# Patient Record
Sex: Male | Born: 1955 | Race: White | Hispanic: No | Smoking: Former smoker
Health system: Southern US, Community
[De-identification: ages and names within clinical notes are randomized; demographics above are authoritative.]

## PROBLEM LIST (undated history)

## (undated) DIAGNOSIS — E119 Type 2 diabetes mellitus without complications: Secondary | ICD-10-CM

## (undated) DIAGNOSIS — I10 Essential (primary) hypertension: Secondary | ICD-10-CM

## (undated) DIAGNOSIS — N289 Disorder of kidney and ureter, unspecified: Secondary | ICD-10-CM

## (undated) DIAGNOSIS — I519 Heart disease, unspecified: Secondary | ICD-10-CM

## (undated) DIAGNOSIS — E785 Hyperlipidemia, unspecified: Secondary | ICD-10-CM

## (undated) HISTORY — PX: CARDIAC DEFIBRILLATOR PLACEMENT: SHX171

## (undated) HISTORY — PX: HERNIA REPAIR: SHX51

---

## 1999-10-30 HISTORY — PX: HEART TRANSPLANT: SHX268

## 2020-10-08 ENCOUNTER — Encounter: Payer: Self-pay | Admitting: Emergency Medicine

## 2020-10-08 ENCOUNTER — Ambulatory Visit
Admission: EM | Admit: 2020-10-08 | Discharge: 2020-10-08 | Disposition: A | Discharge status: Home / Self Care | Payer: Medicare Other | Attending: Family Medicine | Admitting: Family Medicine

## 2020-10-08 ENCOUNTER — Other Ambulatory Visit: Payer: Self-pay

## 2020-10-08 ENCOUNTER — Ambulatory Visit (INDEPENDENT_AMBULATORY_CARE_PROVIDER_SITE_OTHER): Payer: Medicare Other

## 2020-10-08 DIAGNOSIS — Z79899 Other long term (current) drug therapy: Secondary | ICD-10-CM | POA: Insufficient documentation

## 2020-10-08 DIAGNOSIS — E1122 Type 2 diabetes mellitus with diabetic chronic kidney disease: Secondary | ICD-10-CM | POA: Diagnosis not present

## 2020-10-08 DIAGNOSIS — J988 Other specified respiratory disorders: Secondary | ICD-10-CM | POA: Insufficient documentation

## 2020-10-08 DIAGNOSIS — Z941 Heart transplant status: Secondary | ICD-10-CM | POA: Insufficient documentation

## 2020-10-08 DIAGNOSIS — R059 Cough, unspecified: Secondary | ICD-10-CM

## 2020-10-08 DIAGNOSIS — Z794 Long term (current) use of insulin: Secondary | ICD-10-CM | POA: Diagnosis not present

## 2020-10-08 DIAGNOSIS — Z87891 Personal history of nicotine dependence: Secondary | ICD-10-CM | POA: Insufficient documentation

## 2020-10-08 DIAGNOSIS — Z7982 Long term (current) use of aspirin: Secondary | ICD-10-CM | POA: Insufficient documentation

## 2020-10-08 DIAGNOSIS — Z8249 Family history of ischemic heart disease and other diseases of the circulatory system: Secondary | ICD-10-CM | POA: Insufficient documentation

## 2020-10-08 DIAGNOSIS — Z7952 Long term (current) use of systemic steroids: Secondary | ICD-10-CM | POA: Insufficient documentation

## 2020-10-08 DIAGNOSIS — Z95810 Presence of automatic (implantable) cardiac defibrillator: Secondary | ICD-10-CM | POA: Diagnosis not present

## 2020-10-08 DIAGNOSIS — I120 Hypertensive chronic kidney disease with stage 5 chronic kidney disease or end stage renal disease: Secondary | ICD-10-CM | POA: Insufficient documentation

## 2020-10-08 DIAGNOSIS — Z20822 Contact with and (suspected) exposure to covid-19: Secondary | ICD-10-CM | POA: Diagnosis not present

## 2020-10-08 DIAGNOSIS — Z992 Dependence on renal dialysis: Secondary | ICD-10-CM | POA: Diagnosis not present

## 2020-10-08 DIAGNOSIS — N186 End stage renal disease: Secondary | ICD-10-CM | POA: Diagnosis not present

## 2020-10-08 DIAGNOSIS — D849 Immunodeficiency, unspecified: Secondary | ICD-10-CM | POA: Diagnosis not present

## 2020-10-08 HISTORY — DX: Essential (primary) hypertension: I10

## 2020-10-08 HISTORY — DX: Hyperlipidemia, unspecified: E78.5

## 2020-10-08 HISTORY — DX: Heart disease, unspecified: I51.9

## 2020-10-08 HISTORY — DX: Type 2 diabetes mellitus without complications: E11.9

## 2020-10-08 HISTORY — DX: Disorder of kidney and ureter, unspecified: N28.9

## 2020-10-08 LAB — RESP PANEL BY RT-PCR (FLU A&B, COVID) ARPGX2
Influenza A by PCR: NEGATIVE
Influenza B by PCR: NEGATIVE
SARS Coronavirus 2 by RT PCR: NEGATIVE

## 2020-10-08 MED ORDER — DOXYCYCLINE HYCLATE 100 MG PO CAPS: 100.0000 mg | ORAL_CAPSULE | Freq: Two times a day (BID) | ORAL | 0 refills | Status: AC

## 2020-10-08 MED ORDER — MOXIFLOXACIN HCL 0.5 % OP SOLN: 1.0000 [drp] | Freq: Three times a day (TID) | OPHTHALMIC | 0 refills | Status: AC

## 2020-10-08 NOTE — ED Triage Notes (Signed)
Patient in today c/o productive (thick yellow/green) cough x 1 week. Patient also c/o watery eyes x 4 days. Patient denies fever. Patient has taken OTC Vicks Formula 44 3 times and Nyquil once. Patient has completed the covid vaccine.

## 2020-10-08 NOTE — Discharge Instructions (Signed)
Medication as prescribed.  Take care  Dr. Nickey Canedo  

## 2020-10-08 NOTE — ED Provider Notes (Signed)
[facility]    CSN: [id number] Arrival date & time: 10/08/20  1541      History   Chief Complaint Chief Complaint  Patient presents with  . Cough  . post nasal drip  . 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  Patient states that he has been sick for the past week.  He reports ongoing cough which is productive of discolored mucus.  He reports ongoing discharge from the eyes.  Discharge is purulent.  He has been vaccine against COVID-19.  He has had a recent sick contact.  He has taken over-the-counter medication without relief.  No documented fever.  No other complaints.  Past Medical History:  Diagnosis Date  . Diabetes mellitus without complication ([place])   . Heart disease   . Hyperlipidemia   . Hypertension   . Renal disorder    Past Surgical History:  Procedure Laterality Date  . CARDIAC DEFIBRILLATOR PLACEMENT    . HEART TRANSPLANT  10/30/1999  . HERNIA REPAIR     Home Medications    Prior to Admission medications   Medication Sig Start Date End Date Taking? Authorizing Provider  aspirin 81 MG EC tablet Take 1 tablet by mouth daily. 12/07/11  Yes [provider]  carvedilol (COREG) 6.25 MG tablet Take 1 tablet by mouth 2 (two) times daily. 05/21/20 05/21/21 Yes [provider]  clopidogrel (PLAVIX) 75 MG tablet Take 1 tablet by mouth daily. 03/07/20  Yes [provider]  cycloSPORINE modified (GENGRAF) 25 MG capsule Take 3 capsules (75mg ) in the morning and 3 capsules (75mg ) in the evening. 04/19/20  Yes [provider]  ezetimibe (ZETIA) 10 MG tablet Take 1 tablet by mouth at bedtime. 03/07/20  Yes [provider]  famotidine (PEPCID) 20 MG tablet Take 1 tablet by mouth daily. 02/21/20  Yes [provider]  insulin glargine (LANTUS SOLOSTAR)  100 UNIT/ML Solostar Pen 42 units in the morning and 30 units evening 11/06/19  Yes [provider]  insulin lispro (HUMALOG) 100 UNIT/ML KwikPen USE AS DIRECTED PER SLIDING SCALE FOR HIGH BLOOD SUGARS BEFORE MEALS. TOTAL DAILY DOSE 15 UNITS 06/20/20  Yes [provider]  predniSONE (DELTASONE) 5 MG tablet Take 1 tablet by mouth daily. 09/11/20  Yes [provider]  rosuvastatin (CRESTOR) 10 MG tablet Take 1 tablet by mouth daily. 06/17/20  Yes [provider]  tamsulosin (FLOMAX) 0.4 MG CAPS capsule Take 1 capsule by mouth daily.   Yes [provider]  doxycycline (VIBRAMYCIN) 100 MG capsule Take 1 capsule (100 mg total) by mouth 2 (two) times daily. 10/08/20   Coral Spikes, DO  moxifloxacin (VIGAMOX) 0.5 % ophthalmic solution Place 1 drop into both eyes 3 (three) times daily for 7 days. 10/08/20 10/15/20  Coral Spikes, DO    Family History Family History  Problem Relation Age of Onset  . Heart attack Mother 71  . Diabetes Mother   . Colon cancer Mother   . Heart attack Father 90    Social History Social History   Tobacco Use  . Smoking status: Former Smoker    Quit date: 12/29/1996    Years since quitting: 23.7  . Smokeless tobacco: Never Used  Vaping Use  . Vaping Use: Never used  Substance Use Topics  . Alcohol use: Yes    Comment: 1 drink  per month  . Drug use: Never     Allergies   Sirolimus and Penicillins   Review of Systems Review of Systems  Eyes: Positive for discharge.  Respiratory: Positive for cough.    Physical Exam Triage Vital Signs ED Triage Vitals  Enc Vitals Group     BP 10/08/20 1627 (!) 153/89     Pulse Rate 10/08/20 1627 86     Resp 10/08/20 1627 20     Temp 10/08/20 1627 99 F (37.2 C)     Temp Source 10/08/20 1627 Oral     SpO2 10/08/20 1627 96 %     Weight 10/08/20 1626 260 lb (117.9 kg)     Height 10/08/20 1626 5\' 10"  (1.778 m)     Head Circumference --      Peak Flow --      Pain Score  10/08/20 1626 0     Pain Loc --      Pain Edu? --      Excl. in [place]? --    Updated Vital Signs BP (!) 153/89 (BP Location: Left Arm)   Pulse 86   Temp 99 F (37.2 C) (Oral)   Resp 20   Ht 5\' 10"  (1.778 m)   Wt 117.9 kg   SpO2 96%   BMI 37.31 kg/m   Visual Acuity Right Eye Distance:   Left Eye Distance:   Bilateral Distance:    Right Eye Near:   Left Eye Near:    Bilateral Near:     Physical Exam Vitals and nursing note reviewed.  Constitutional:      General: He is not in acute distress.    Appearance: Normal appearance. He is not ill-appearing.  HENT:     Head: Normocephalic and atraumatic.  Eyes:     Comments: Bilateral purulent eye discharge.  Cardiovascular:     Rate and Rhythm: Normal rate and regular rhythm.  Pulmonary:     Effort: Pulmonary effort is normal.     Breath sounds: Normal breath sounds. No wheezing or rales.  Neurological:     Mental Status: He is alert.  Psychiatric:        Mood and Affect: Mood normal.        Behavior: Behavior normal.    UC Treatments / Results  Labs (all labs ordered are listed, but only abnormal results are displayed) Labs Reviewed  RESP PANEL BY RT-PCR (FLU A&B, COVID) [id number]    EKG   Radiology DG Chest 2 View  Result Date: 10/08/2020 CLINICAL DATA:  Cough EXAM: CHEST - 2 VIEW COMPARISON:  None. FINDINGS: Post sternotomy changes. Left-sided single lead pacing device with lead over right ventricle. No consolidation or pleural effusion. Normal cardiac size. Aortic atherosclerosis. No pneumothorax. IMPRESSION: No active cardiopulmonary disease. Electronically Signed   By: Donavan Foil M.D.   On: 10/08/2020 17:22    Procedures Procedures (including critical care time)  Medications Ordered in UC Medications - No data to display  Initial Impression / Assessment and Plan / UC Course  I have reviewed the triage vital signs and the nursing notes.  Pertinent labs & imaging results that were available during my  care of the patient were reviewed by me and considered in my medical decision making (see chart for details).    64 year old male presents with respiratory infection.  Flu negative.  Covid negative.  Chest x-ray was obtained and was independently reviewed by me.  No acute infiltrates.  Given immunocompromise status, patient placed  on antibiotic therapy.   Final Clinical Impressions(s) / UC Diagnoses   Final diagnoses:  Respiratory infection  Immunocompromised [facility])     Discharge Instructions     Medication as prescribed.  Take care  Dr. Lacinda Axon     ED Prescriptions    Medication Sig Dispense Auth. Provider   doxycycline (VIBRAMYCIN) 100 MG capsule Take 1 capsule (100 mg total) by mouth 2 (two) times daily. 14 capsule Shaquina Gillham G, DO   moxifloxacin (VIGAMOX) 0.5 % ophthalmic solution Place 1 drop into both eyes 3 (three) times daily for 7 days. 3 mL Coral Spikes, DO     PDMP not reviewed this encounter.   Coral Spikes, [place] 10/08/20 1805

## 2020-11-17 ENCOUNTER — Other Ambulatory Visit: Payer: Self-pay

## 2020-11-17 ENCOUNTER — Ambulatory Visit
Admission: EM | Admit: 2020-11-17 | Discharge: 2020-11-17 | Disposition: A | Discharge status: Home / Self Care | Payer: Medicare Other | Attending: Family Medicine | Admitting: Family Medicine

## 2020-11-17 ENCOUNTER — Ambulatory Visit (INDEPENDENT_AMBULATORY_CARE_PROVIDER_SITE_OTHER): Payer: Medicare Other

## 2020-11-17 DIAGNOSIS — R059 Cough, unspecified: Secondary | ICD-10-CM

## 2020-11-17 DIAGNOSIS — U071 COVID-19: Secondary | ICD-10-CM | POA: Insufficient documentation

## 2020-11-17 MED ORDER — BENZONATATE 100 MG PO CAPS: 100.0000 mg | ORAL_CAPSULE | Freq: Three times a day (TID) | ORAL | 0 refills | Status: AC | PRN

## 2020-11-17 NOTE — ED Triage Notes (Signed)
Pt reports he started with vomiting 2 weeks ago. Last week about 4 days ago had sudden onset diarrhea. Endorses non-productive cough and no fever.

## 2020-11-17 NOTE — ED Provider Notes (Signed)
[facility]    CSN: [id number] Arrival date & time: 11/17/20  1002      History   Chief Complaint Chief Complaint  Patient presents with  . Cough   HPI  65 year old male presents with cough.  Patient reports that he has had an ongoing cough over the past week.  He states his temperature has been elevated but not higher than 99.  Cough is nonproductive.  He has had recent diarrhea and recent vomiting which have both resolved.  No relieving factors.  Patient is high risk due to immunocompromise status.  No known contacts with COVID-19.  No other complaints.  Past Medical History:  Diagnosis Date  . Diabetes mellitus without complication ([place])   . Heart disease   . Hyperlipidemia   . Hypertension   . Renal disorder    Past Surgical History:  Procedure Laterality Date  . CARDIAC DEFIBRILLATOR PLACEMENT    . HEART TRANSPLANT  10/30/1999  . HERNIA REPAIR     Home Medications    Prior to Admission medications   Medication Sig Start Date End Date Taking? Authorizing Provider  benzonatate (TESSALON) 100 MG capsule Take 1 capsule (100 mg total) by mouth 3 (three) times daily as needed. 11/17/20  Yes Vera Wishart G, DO  aspirin 81 MG EC tablet Take 1 tablet by mouth daily. 12/07/11   [provider]  carvedilol (COREG) 6.25 MG tablet Take 1 tablet by mouth 2 (two) times daily. 05/21/20 05/21/21  [provider]  clopidogrel (PLAVIX) 75 MG tablet Take 1 tablet by mouth daily. 03/07/20   [provider]  cycloSPORINE modified (GENGRAF) 25 MG capsule Take 3 capsules (75mg ) in the morning and 3 capsules (75mg ) in the evening. 04/19/20   [provider]  doxycycline (VIBRAMYCIN) 100 MG capsule Take 1 capsule (100 mg total) by mouth 2 (two) times daily. 10/08/20   Coral Spikes, DO  ezetimibe (ZETIA) 10 MG tablet Take 1 tablet by mouth at bedtime. 03/07/20   [provider]  famotidine (PEPCID) 20 MG tablet Take 1 tablet by mouth daily. 02/21/20    [provider]  insulin glargine (LANTUS SOLOSTAR) 100 UNIT/ML Solostar Pen 42 units in the morning and 30 units evening 11/06/19   [provider]  insulin lispro (HUMALOG) 100 UNIT/ML KwikPen USE AS DIRECTED PER SLIDING SCALE FOR HIGH BLOOD SUGARS BEFORE MEALS. TOTAL DAILY DOSE 15 UNITS 06/20/20   [provider]  predniSONE (DELTASONE) 5 MG tablet Take 1 tablet by mouth daily. 09/11/20   [provider]  rosuvastatin (CRESTOR) 10 MG tablet Take 1 tablet by mouth daily. 06/17/20   [provider]  tamsulosin (FLOMAX) 0.4 MG CAPS capsule Take 1 capsule by mouth daily.    [provider]    Family History Family History  Problem Relation Age of Onset  . Heart attack Mother 27  . Diabetes Mother   . Colon cancer Mother   . Heart attack Father 53    Social History Social History   Tobacco Use  . Smoking status: Former Smoker    Quit date: 12/29/1996    Years since quitting: 23.9  . Smokeless tobacco: Never Used  Vaping Use  . Vaping Use: Never used  Substance Use Topics  . Alcohol use: Yes    Comment: 1 drink per month  . Drug use: Never     Allergies   Sirolimus and Penicillins   Review of Systems Review of Systems  Constitutional: Negative  for fever.  Respiratory: Positive for cough.    Physical Exam Triage Vital Signs ED Triage Vitals  Enc Vitals Group     BP 11/17/20 1234 (!) 147/82     Pulse Rate 11/17/20 1234 75     Resp 11/17/20 1234 17     Temp 11/17/20 1234 98.6 F (37 C)     Temp Source 11/17/20 1234 Oral     SpO2 11/17/20 1234 95 %     Weight 11/17/20 1233 231 lb 7.7 oz (105 kg)     Height 11/17/20 1233 5\' 10"  (1.778 m)     Head Circumference --      Peak Flow --      Pain Score 11/17/20 1233 0     Pain Loc --      Pain Edu? --      Excl. in [place]? --    Updated Vital Signs BP (!) 147/82 (BP Location: Left Arm)   Pulse 75   Temp 98.6 F (37 C) (Oral)   Resp 17   Ht 5\' 10"  (1.778 m)   Wt 105  kg   SpO2 95%   BMI 33.21 kg/m   Visual Acuity Right Eye Distance:   Left Eye Distance:   Bilateral Distance:    Right Eye Near:   Left Eye Near:    Bilateral Near:     Physical Exam Vitals and nursing note reviewed.  Constitutional:      General: He is not in acute distress.    Appearance: He is not ill-appearing.  HENT:     Head: Normocephalic and atraumatic.  Eyes:     General:        Right eye: No discharge.        Left eye: No discharge.     Conjunctiva/sclera: Conjunctivae normal.  Cardiovascular:     Rate and Rhythm: Normal rate and regular rhythm.  Pulmonary:     Effort: Pulmonary effort is normal.     Breath sounds: No wheezing or rales.  Neurological:     Mental Status: He is alert.  Psychiatric:        Mood and Affect: Mood normal.        Behavior: Behavior normal.    UC Treatments / Results  Labs (all labs ordered are listed, but only abnormal results are displayed) Labs Reviewed  SARS CORONAVIRUS 2 (TAT 6-24 HRS)    EKG   Radiology DG Chest 2 View  Result Date: 11/17/2020 CLINICAL DATA:  Cough. EXAM: CHEST - 2 VIEW COMPARISON:  October 08, 2020. FINDINGS: The heart size and mediastinal contours are within normal limits. Both lungs are clear. No pneumothorax or pleural effusion is noted. Left-sided pacemaker is unchanged in position. The visualized skeletal structures are unremarkable. IMPRESSION: No active cardiopulmonary disease. Aortic Atherosclerosis (ICD10-I70.0). Electronically Signed   By: Marijo Conception M.D.   On: 11/17/2020 13:14    Procedures Procedures (including critical care time)  Medications Ordered in UC Medications - No data to display  Initial Impression / Assessment and Plan / UC Course  I have reviewed the triage vital signs and the nursing notes.  Pertinent labs & imaging results that were available during my care of the patient were reviewed by me and considered in my medical decision making (see chart for details).     65 year old male presents with cough.  Given comorbidities, chest x-ray was obtained.  Chest x-ray independently reviewed by me.  No apparent infiltrate.  Tessalon Perles for  cough.  Supportive care.  Awaiting Covid test results.  Final Clinical Impressions(s) / UC Diagnoses   Final diagnoses:  Cough     Discharge Instructions     Medication as prescribed.  Awaiting COVID test result.  Chest xray was negative.  Take care  Dr. Lacinda Axon    ED Prescriptions    Medication Sig Dispense Auth. Provider   benzonatate (TESSALON) 100 MG capsule Take 1 capsule (100 mg total) by mouth 3 (three) times daily as needed. 30 capsule Coral Spikes, DO     PDMP not reviewed this encounter.   Coral Spikes, DO 11/17/20 1440

## 2020-11-17 NOTE — Discharge Instructions (Signed)
Medication as prescribed.  Awaiting COVID test result.  Chest xray was negative.  Take care  Dr. Lacinda Axon

## 2020-11-18 LAB — SARS CORONAVIRUS 2 (TAT 6-24 HRS): SARS Coronavirus 2: POSITIVE — AB

## 2020-11-19 ENCOUNTER — Telehealth: Payer: Self-pay | Admitting: Infectious Diseases

## 2020-11-19 NOTE — Telephone Encounter (Signed)
Called to discuss with patient about COVID-19 symptoms and the use of one of the available treatments for those with mild to moderate Covid symptoms and at a high risk of hospitalization.  Pt appears to qualify for outpatient treatment due to co-morbid conditions and/or a member of an at-risk group in accordance with the FDA Emergency Use Authorization.    Symptom onset: day 6 or 7 per chart. CXR 1/2 negative  Vaccinated: not documented   Qualifiers: heart transplant (s/p 2000)   Unable to reach pt - LVM with my work cell so he can reach me back. Have 1 appt left today for sotrovimab would like to get him in for treatment.    Janene Madeira

## 2021-07-02 ENCOUNTER — Emergency Department: Payer: Medicare Other

## 2021-07-02 ENCOUNTER — Other Ambulatory Visit: Payer: Self-pay

## 2021-07-02 ENCOUNTER — Encounter: Payer: Self-pay | Admitting: Emergency Medicine

## 2021-07-02 ENCOUNTER — Emergency Department
Admission: EM | Admit: 2021-07-02 | Discharge: 2021-07-03 | Disposition: A | Discharge status: Other Acute Inpatient Hospital | Payer: Medicare Other | Attending: Emergency Medicine | Admitting: Emergency Medicine

## 2021-07-02 DIAGNOSIS — Y9289 Other specified places as the place of occurrence of the external cause: Secondary | ICD-10-CM | POA: Diagnosis not present

## 2021-07-02 DIAGNOSIS — E1122 Type 2 diabetes mellitus with diabetic chronic kidney disease: Secondary | ICD-10-CM | POA: Insufficient documentation

## 2021-07-02 DIAGNOSIS — S79912A Unspecified injury of left hip, initial encounter: Secondary | ICD-10-CM | POA: Diagnosis present

## 2021-07-02 DIAGNOSIS — N186 End stage renal disease: Secondary | ICD-10-CM | POA: Diagnosis not present

## 2021-07-02 DIAGNOSIS — Z79899 Other long term (current) drug therapy: Secondary | ICD-10-CM | POA: Insufficient documentation

## 2021-07-02 DIAGNOSIS — W010XXA Fall on same level from slipping, tripping and stumbling without subsequent striking against object, initial encounter: Secondary | ICD-10-CM | POA: Diagnosis not present

## 2021-07-02 DIAGNOSIS — R531 Weakness: Secondary | ICD-10-CM | POA: Diagnosis not present

## 2021-07-02 DIAGNOSIS — Z7982 Long term (current) use of aspirin: Secondary | ICD-10-CM | POA: Insufficient documentation

## 2021-07-02 DIAGNOSIS — Z794 Long term (current) use of insulin: Secondary | ICD-10-CM | POA: Diagnosis not present

## 2021-07-02 DIAGNOSIS — Z7902 Long term (current) use of antithrombotics/antiplatelets: Secondary | ICD-10-CM | POA: Insufficient documentation

## 2021-07-02 DIAGNOSIS — Y9389 Activity, other specified: Secondary | ICD-10-CM | POA: Insufficient documentation

## 2021-07-02 DIAGNOSIS — S72002A Fracture of unspecified part of neck of left femur, initial encounter for closed fracture: Secondary | ICD-10-CM | POA: Insufficient documentation

## 2021-07-02 DIAGNOSIS — M25552 Pain in left hip: Secondary | ICD-10-CM

## 2021-07-02 DIAGNOSIS — I120 Hypertensive chronic kidney disease with stage 5 chronic kidney disease or end stage renal disease: Secondary | ICD-10-CM | POA: Insufficient documentation

## 2021-07-02 DIAGNOSIS — Z20822 Contact with and (suspected) exposure to covid-19: Secondary | ICD-10-CM | POA: Diagnosis not present

## 2021-07-02 DIAGNOSIS — Z87891 Personal history of nicotine dependence: Secondary | ICD-10-CM | POA: Diagnosis not present

## 2021-07-02 LAB — CBC WITH DIFFERENTIAL/PLATELET
Abs Immature Granulocytes: 0.03 10*3/uL (ref 0.00–0.07)
Basophils Absolute: 0 10*3/uL (ref 0.0–0.1)
Basophils Relative: 1 %
Eosinophils Absolute: 0.1 10*3/uL (ref 0.0–0.5)
Eosinophils Relative: 2 %
HCT: 36.9 % — ABNORMAL LOW (ref 39.0–52.0)
Hemoglobin: 12.6 g/dL — ABNORMAL LOW (ref 13.0–17.0)
Immature Granulocytes: 0 %
Lymphocytes Relative: 9 %
Lymphs Abs: 0.7 10*3/uL (ref 0.7–4.0)
MCH: 32.9 pg (ref 26.0–34.0)
MCHC: 34.1 g/dL (ref 30.0–36.0)
MCV: 96.3 fL (ref 80.0–100.0)
Monocytes Absolute: 0.6 10*3/uL (ref 0.1–1.0)
Monocytes Relative: 8 %
Neutro Abs: 6.1 10*3/uL (ref 1.7–7.7)
Neutrophils Relative %: 80 %
Platelets: 133 10*3/uL — ABNORMAL LOW (ref 150–400)
RBC: 3.83 MIL/uL — ABNORMAL LOW (ref 4.22–5.81)
RDW: 14.6 % (ref 11.5–15.5)
WBC: 7.6 10*3/uL (ref 4.0–10.5)
nRBC: 0 % (ref 0.0–0.2)

## 2021-07-02 LAB — COMPREHENSIVE METABOLIC PANEL
ALT: 22 U/L (ref 0–44)
AST: 29 U/L (ref 15–41)
Albumin: 3.6 g/dL (ref 3.5–5.0)
Alkaline Phosphatase: 138 U/L — ABNORMAL HIGH (ref 38–126)
Anion gap: 14 (ref 5–15)
BUN: 69 mg/dL — ABNORMAL HIGH (ref 8–23)
CO2: 25 mmol/L (ref 22–32)
Calcium: 9.1 mg/dL (ref 8.9–10.3)
Chloride: 97 mmol/L — ABNORMAL LOW (ref 98–111)
Creatinine, Ser: 5.66 mg/dL — ABNORMAL HIGH (ref 0.61–1.24)
GFR, Estimated: 11 mL/min — ABNORMAL LOW (ref >60)
Glucose, Bld: 191 mg/dL — ABNORMAL HIGH (ref 70–99)
Potassium: 4.3 mmol/L (ref 3.5–5.1)
Sodium: 136 mmol/L (ref 135–145)
Total Bilirubin: 2 mg/dL — ABNORMAL HIGH (ref 0.3–1.2)
Total Protein: 7 g/dL (ref 6.5–8.1)

## 2021-07-02 LAB — TYPE AND SCREEN
ABO/RH(D): O POS
Antibody Screen: NEGATIVE

## 2021-07-02 LAB — RESP PANEL BY RT-PCR (FLU A&B, COVID) ARPGX2
Influenza A by PCR: NEGATIVE
Influenza B by PCR: NEGATIVE
SARS Coronavirus 2 by RT PCR: NEGATIVE

## 2021-07-02 LAB — PROTIME-INR
INR: 1.1 (ref 0.8–1.2)
Prothrombin Time: 14.6 seconds (ref 11.4–15.2)

## 2021-07-02 MED ORDER — CARVEDILOL 6.25 MG PO TABS
6.2500 mg | ORAL_TABLET | Freq: Two times a day (BID) | ORAL | Status: DC
  Administered 2021-07-02 (×2): 6.25 mg via ORAL
  Filled 2021-07-02 (×2): qty 1

## 2021-07-02 MED ORDER — SODIUM CHLORIDE 0.9 % IV BOLUS
500.0000 mL | Freq: Once | INTRAVENOUS | Status: AC
  Administered 2021-07-02: 500 mL via INTRAVENOUS

## 2021-07-02 MED ORDER — ONDANSETRON HCL 4 MG/2ML IJ SOLN
4.0000 mg | Freq: Once | INTRAMUSCULAR | Status: AC
  Administered 2021-07-02: 4 mg via INTRAVENOUS
  Filled 2021-07-02: qty 2

## 2021-07-02 MED ORDER — FAMOTIDINE 20 MG PO TABS
20.0000 mg | ORAL_TABLET | Freq: Every day | ORAL | Status: DC
  Administered 2021-07-02: 20 mg via ORAL
  Filled 2021-07-02: qty 1

## 2021-07-02 MED ORDER — HYDROMORPHONE HCL 1 MG/ML IJ SOLN
0.5000 mg | INTRAMUSCULAR | Status: DC | PRN
Start: 2021-07-02 — End: 2021-07-03
  Administered 2021-07-03: 0.5 mg via INTRAVENOUS
  Filled 2021-07-02: qty 1

## 2021-07-02 MED ORDER — PREDNISONE 10 MG PO TABS
5.0000 mg | ORAL_TABLET | Freq: Every day | ORAL | Status: DC
  Administered 2021-07-02: 5 mg via ORAL
  Filled 2021-07-02: qty 1

## 2021-07-02 MED ORDER — CYCLOSPORINE MODIFIED (NEORAL) 25 MG PO CAPS
75.0000 mg | ORAL_CAPSULE | Freq: Two times a day (BID) | ORAL | Status: DC
  Administered 2021-07-02 (×2): 75 mg via ORAL
  Filled 2021-07-02 (×3): qty 3

## 2021-07-02 MED ORDER — HYDROMORPHONE HCL 1 MG/ML IJ SOLN
0.5000 mg | Freq: Once | INTRAMUSCULAR | Status: AC
Start: 2021-07-02 — End: 2021-07-02
  Administered 2021-07-02: 0.5 mg via INTRAVENOUS
  Filled 2021-07-02: qty 1

## 2021-07-02 NOTE — ED Notes (Signed)
This RN updated this pt at this time on POC and timeline.

## 2021-07-02 NOTE — ED Notes (Signed)
Darin Barnett from [facility] called with acceptance to ED main [facility], by Dr. Erick Alley   report [phone]

## 2021-07-02 NOTE — ED Notes (Signed)
Spoke to [place] at [facility] will be after 7p for transport  [employer]

## 2021-07-02 NOTE — ED Notes (Signed)
BBID [id number]  Applied to this pt's L wrist beside identification band at this time by this RN.

## 2021-07-02 NOTE — ED Notes (Signed)
This RN spoke with V Licensed conveyancer at [facility], accepting facility, at this time and gave report. Pt condition, dx, vs, and mentation all relayed to previously mentioned RN.

## 2021-07-02 NOTE — ED Notes (Signed)
New Type and Screen sent to lab at this time per their request d/t previous sample hemolysis

## 2021-07-02 NOTE — ED Provider Notes (Signed)
[facility] Emergency Department Provider Note  ____________________________________________   Event Date/Time   First MD Initiated Contact with Patient 07/02/21 1017     (approximate)  I have reviewed the triage vital signs and the nursing notes.   HISTORY  Chief Complaint Fall    HPI Ketan Mueller is a 65 y.o. male with past medical history as below including hypertension, hyperlipidemia, diabetes, heart transplant, end-stage renal disease, followed by [facility], here with fall.  The patient was in his usual state of health this morning.  He was getting ready to go to dialysis when he tripped, falling onto his left hip.  He reports immediate onset of severe, 10 out of 10, aching, throbbing, hip pain.  He has had inability to ambulate due to this pain since then.  Denies history of previous injury to this area.  Denies any distal numbness or weakness.  No overt head injury.  No neck pain.  No other complaints or trauma.    Past Medical History:  Diagnosis Date   Diabetes mellitus without complication ([place])    Heart disease    Hyperlipidemia    Hypertension    Renal disorder     There are no problems to display for this patient.   Past Surgical History:  Procedure Laterality Date   CARDIAC DEFIBRILLATOR PLACEMENT     HEART TRANSPLANT  10/30/1999   HERNIA REPAIR      Prior to Admission medications   Medication Sig Start Date End Date Taking? Authorizing Provider  aspirin 81 MG EC tablet Take 1 tablet by mouth daily. 12/07/11   [provider]  benzonatate (TESSALON) 100 MG capsule Take 1 capsule (100 mg total) by mouth 3 (three) times daily as needed. 11/17/20   Coral Spikes, DO  carvedilol (COREG) 6.25 MG tablet Take 1 tablet by mouth 2 (two) times daily. 05/21/20 05/21/21  [provider]  clopidogrel (PLAVIX) 75 MG tablet Take 1 tablet by mouth daily. 03/07/20   [provider]  cycloSPORINE modified (GENGRAF) 25 MG capsule  Take 3 capsules ('75mg'$ ) in the morning and 3 capsules ('75mg'$ ) in the evening. 04/19/20   [provider]  doxycycline (VIBRAMYCIN) 100 MG capsule Take 1 capsule (100 mg total) by mouth 2 (two) times daily. 10/08/20   Coral Spikes, DO  ezetimibe (ZETIA) 10 MG tablet Take 1 tablet by mouth at bedtime. 03/07/20   [provider]  famotidine (PEPCID) 20 MG tablet Take 1 tablet by mouth daily. 02/21/20   [provider]  insulin glargine (LANTUS SOLOSTAR) 100 UNIT/ML Solostar Pen 42 units in the morning and 30 units evening 11/06/19   [provider]  insulin lispro (HUMALOG) 100 UNIT/ML KwikPen USE AS DIRECTED PER SLIDING SCALE FOR HIGH BLOOD SUGARS BEFORE MEALS. TOTAL DAILY DOSE 15 UNITS 06/20/20   [provider]  predniSONE (DELTASONE) 5 MG tablet Take 1 tablet by mouth daily. 09/11/20   [provider]  rosuvastatin (CRESTOR) 10 MG tablet Take 1 tablet by mouth daily. 06/17/20   [provider]  tamsulosin (FLOMAX) 0.4 MG CAPS capsule Take 1 capsule by mouth daily.    [provider]    Allergies Sirolimus and Penicillins  Family History  Problem Relation Age of Onset   Heart attack Mother 78   Diabetes Mother    Colon cancer Mother    Heart attack Father 54    Social History Social History   Tobacco Use   Smoking status: Former  Types: Cigarettes    Quit date: 12/29/1996    Years since quitting: 24.5   Smokeless tobacco: Never  Vaping Use   Vaping Use: Never used  Substance Use Topics   Alcohol use: Yes    Comment: 1 drink per month   Drug use: Never    Review of Systems  Review of Systems  Constitutional:  Positive for fatigue. Negative for chills and fever.  HENT:  Negative for sore throat.   Respiratory:  Negative for shortness of breath.   Cardiovascular:  Negative for chest pain.  Gastrointestinal:  Negative for abdominal pain.  Genitourinary:  Negative for flank pain.  Musculoskeletal:  Positive for  arthralgias, gait problem and joint swelling. Negative for neck pain.  Skin:  Negative for rash and wound.  Allergic/Immunologic: Negative for immunocompromised state.  Neurological:  Positive for weakness. Negative for numbness.  Hematological:  Does not bruise/bleed easily.  All other systems reviewed and are negative.   ____________________________________________  PHYSICAL EXAM:      VITAL SIGNS: ED Triage Vitals [07/02/21 1016]  Enc Vitals Group     BP      Pulse      Resp      Temp      Temp src      SpO2      Weight 231 lb 7.7 oz (105 kg)     Height '5\' 10"'$  (1.778 m)     Head Circumference      Peak Flow      Pain Score 5     Pain Loc      Pain Edu?      Excl. in [place]?      Physical Exam Vitals and nursing note reviewed.  Constitutional:      General: He is not in acute distress.    Appearance: He is well-developed.  HENT:     Head: Normocephalic and atraumatic.  Eyes:     Conjunctiva/sclera: Conjunctivae normal.  Cardiovascular:     Rate and Rhythm: Normal rate and regular rhythm.     Heart sounds: Normal heart sounds.  Pulmonary:     Effort: Pulmonary effort is normal. No respiratory distress.     Breath sounds: No wheezing.  Abdominal:     General: There is no distension.  Musculoskeletal:     Cervical back: Neck supple.     Comments: Slight shortening and external rotation of the left hip.  There is exquisite tenderness with any passive range of motion of the hip.  Distal strength and sensation is 5 out of 5 and fully intact.  DP and PT pulses are symmetric.  Skin:    General: Skin is warm.     Capillary Refill: Capillary refill takes less than 2 seconds.     Findings: No rash.  Neurological:     Mental Status: He is alert and oriented to person, place, and time.     Motor: No abnormal muscle tone.      ____________________________________________   LABS (all labs ordered are listed, but only abnormal results are displayed)  Labs Reviewed   CBC WITH DIFFERENTIAL/PLATELET - Abnormal; Notable for the following components:      Result Value   RBC 3.83 (*)    Hemoglobin 12.6 (*)    HCT 36.9 (*)    Platelets 133 (*)    All other components within normal limits  COMPREHENSIVE METABOLIC PANEL - Abnormal; Notable for the following components:   Chloride 97 (*)  Glucose, Bld 191 (*)    BUN 69 (*)    Creatinine, Ser 5.66 (*)    Alkaline Phosphatase 138 (*)    Total Bilirubin 2.0 (*)    GFR, Estimated 11 (*)    All other components within normal limits  RESP PANEL BY RT-PCR (FLU A&B, COVID) ARPGX2  PROTIME-INR  TYPE AND SCREEN  TYPE AND SCREEN    ____________________________________________  EKG: Normal sinus rhythm, ventricular rate 91.  PR 188, QRS 168, QTc 541.  Right bundle branch block.  No acute ST elevations or depressions. ________________________________________  RADIOLOGY All imaging, including plain films, CT scans, and ultrasounds, independently reviewed by me, and interpretations confirmed via formal radiology reads.  ED MD interpretation:   CT head: No acute intracranial abnormality Chest x-ray: Negative X-ray hip left: Suspicious for nondisplaced left femoral intertrochanteric fracture CT hip left: Acute intertrochanteric fracture of the proximal left femur without significant displacement  Official radiology report(s): CT HEAD WO CONTRAST (5MM)  Result Date: 07/02/2021 CLINICAL DATA:  Head trauma, mod-severe EXAM: CT HEAD WITHOUT CONTRAST TECHNIQUE: Contiguous axial images were obtained from the base of the skull through the vertex without intravenous contrast. COMPARISON:  None. FINDINGS: Brain: No evidence of acute infarction, hemorrhage, hydrocephalus, extra-axial collection or mass lesion/mass effect. Scattered low-density changes within the periventricular and subcortical white matter compatible with chronic microvascular ischemic change. Mild diffuse cerebral volume loss. Vascular: Atherosclerotic  calcifications involving the large vessels of the skull base. No unexpected hyperdense vessel. Skull: Normal. Negative for fracture or focal lesion. Sinuses/Orbits: No acute finding. Other: Negative for scalp hematoma. Extensive small vessel arterial calcifications within the soft tissues. IMPRESSION: 1. No acute intracranial findings. 2. Chronic microvascular ischemic change and cerebral volume loss. Electronically Signed   By: Davina Poke D.O.   On: 07/02/2021 11:48   CT Hip Left Wo Contrast  Result Date: 07/02/2021 CLINICAL DATA:  Evaluate left hip fracture EXAM: CT OF THE LEFT HIP WITHOUT CONTRAST TECHNIQUE: Multidetector CT imaging of the left hip was performed according to the standard protocol. Multiplanar CT image reconstructions were also generated. COMPARISON:  Same-day x-ray FINDINGS: Bones/Joint/Cartilage Acute intertrochanteric fracture of the proximal left femur without significant displacement (series 11, images 32-49). Mild anterior apex angulation. No fracture extension is seen into the left femoral head or neck. Hip joint is intact without dislocation. Mild left hip osteoarthritis. The visualized portion of the left hemipelvis is intact without evidence of fracture or diastasis. Ligaments Suboptimally assessed by CT. Muscles and Tendons No acute musculotendinous abnormality by CT. Soft tissues Mild edema within the soft tissues overlying the posterolateral aspect of the left hip. No organized hematoma. No left inguinal lymphadenopathy. Extensive age advanced atherosclerotic calcifications. Extensive sigmoid diverticulosis. IMPRESSION: 1. Acute intertrochanteric fracture of the proximal left femur without significant displacement. Mild anterior apex angulation. 2. Mild left hip osteoarthritis. 3. Extensive age advanced atherosclerotic calcifications. 4. Sigmoid diverticulosis. Electronically Signed   By: Davina Poke D.O.   On: 07/02/2021 12:51   DG Chest Portable 1 View  Result  Date: 07/02/2021 CLINICAL DATA:  Fall. EXAM: PORTABLE CHEST 1 VIEW COMPARISON:  Chest x-ray dated November 17, 2020. FINDINGS: Unchanged left chest wall pacemaker. Mild cardiomegaly. Normal pulmonary vascularity. No focal consolidation, pleural effusion, or pneumothorax. No acute osseous abnormality. IMPRESSION: 1. No active disease. Electronically Signed   By: Titus Dubin M.D.   On: 07/02/2021 11:54   DG HIP UNILAT WITH PELVIS 2-3 VIEWS LEFT  Result Date: 07/02/2021 CLINICAL DATA:  Fall, left  hip pain EXAM: DG HIP (WITH OR WITHOUT PELVIS) 2-3V LEFT COMPARISON:  None. FINDINGS: Cortical irregularity along the anterior cortex of the proximal left femur in the intertrochanteric region is best seen on lateral view. No significant displacement or angulation. Bilateral hip joints are intact without dislocation. Mild bilateral hip osteoarthritis. Bones appear demineralized. Age advanced atherosclerotic calcifications. IMPRESSION: 1. Findings suspicious for a nondisplaced left femoral intertrochanteric fracture. Consider CT for further characterization. 2. Mild bilateral hip osteoarthritis. 3. Age advanced atherosclerosis. Electronically Signed   By: Davina Poke D.O.   On: 07/02/2021 11:53    ____________________________________________  PROCEDURES   Procedure(s) performed (including Critical Care):  Procedures  ____________________________________________  INITIAL IMPRESSION / MDM / [place] / ED COURSE  As part of my medical decision making, I reviewed the following data within the [place] notes reviewed and incorporated, Old chart reviewed, Notes from prior ED visits, and [place] Controlled Substance Database       *Edo Grzyb was evaluated in Emergency Department on 07/02/2021 for the symptoms described in the history of present illness. He was evaluated in the context of the global COVID-19 pandemic, which necessitated consideration that the patient  might be at risk for infection with the SARS-CoV-2 virus that causes COVID-19. Institutional protocols and algorithms that pertain to the evaluation of patients at risk for COVID-19 are in a state of rapid change based on information released by regulatory bodies including the CDC and federal and state organizations. These policies and algorithms were followed during the patient's care in the ED.  Some ED evaluations and interventions may be delayed as a result of limited staffing during the pandemic.*     Medical Decision Making: 65 year old male with extensive past medical history including end-stage renal disease and history of cardiac transplant followed by [facility] here with left hip pain after mechanical fall.  Imaging shows left intertrochanteric hip fracture.  He is neurovascularly intact distally.  Given his complex medical history including history of cardiac transplant, patient is very high risk anesthesia and will be transferred to [facility] for admission to his transplant team and medical optimization.  He is due for his dialysis today but is in no distress with acceptable bicarb and potassium no evidence of overt fluid overload.  Otherwise, no apparent alternative injuries.  CBC unremarkable.  Patient in agreement with this plan.  Pain improving with Dilaudid 0.5 mg.  Accepted to [facility] by Dr. Posey Pronto.  ____________________________________________  FINAL CLINICAL IMPRESSION(S) / ED DIAGNOSES  Final diagnoses:  Left hip pain  Closed fracture of left hip, initial encounter [facility])     MEDICATIONS GIVEN DURING THIS VISIT:  Medications  predniSONE (DELTASONE) tablet 5 mg (5 mg Oral Given 07/02/21 1248)  cycloSPORINE modified (NEORAL) capsule 75 mg (75 mg Oral Given 07/02/21 1248)  carvedilol (COREG) tablet 6.25 mg (6.25 mg Oral Given 07/02/21 1248)  famotidine (PEPCID) tablet 20 mg (20 mg Oral Given 07/02/21 1248)  HYDROmorphone (DILAUDID) injection 0.5 mg (0.5 mg Intravenous Given 07/02/21 1139)   ondansetron (ZOFRAN) injection 4 mg (4 mg Intravenous Given 07/02/21 1139)  ondansetron (ZOFRAN) injection 4 mg (4 mg Intravenous Given 07/02/21 1328)  HYDROmorphone (DILAUDID) injection 0.5 mg (0.5 mg Intravenous Given 07/02/21 1328)     ED Discharge Orders     None        Note:  This document was prepared using Dragon voice recognition software and may include unintentional dictation errors.   Duffy Bruce, MD 07/02/21 [phone]

## 2021-07-02 NOTE — ED Triage Notes (Signed)
Fall this morning, slipped while cleaning up after dog.  Left hip pain, left leg shortened and externally rotated. 18g IV left hadn.  Dialysis M-W-F.  Hx heart transplant.

## 2021-07-02 NOTE — ED Notes (Signed)
Called [facility] transport concerning pick up for transfer. Per Kerry Dory they will be here as soon as a truck available

## 2021-07-02 NOTE — ED Notes (Signed)
Called [facility] for transfer spoke to [place], images powershared to [place]

## 2021-07-03 DIAGNOSIS — S72002A Fracture of unspecified part of neck of left femur, initial encounter for closed fracture: Secondary | ICD-10-CM | POA: Diagnosis not present

## 2021-07-03 MED ORDER — ONDANSETRON HCL 4 MG/2ML IJ SOLN
4.0000 mg | Freq: Once | INTRAMUSCULAR | Status: AC
  Administered 2021-07-03: 4 mg via INTRAVENOUS
  Filled 2021-07-03: qty 2

## 2022-02-06 ENCOUNTER — Other Ambulatory Visit
Admission: RE | Admit: 2022-02-06 | Discharge: 2022-02-06 | Disposition: A | Discharge status: Home / Self Care | Payer: Medicare Other | Source: Ambulatory Visit | Attending: Nephrology | Admitting: Nephrology

## 2022-02-06 DIAGNOSIS — N186 End stage renal disease: Secondary | ICD-10-CM | POA: Insufficient documentation

## 2022-02-06 DIAGNOSIS — R509 Fever, unspecified: Secondary | ICD-10-CM | POA: Insufficient documentation

## 2022-02-07 LAB — MISC LABCORP TEST (SEND OUT)
LabCorp test name: 8300
Labcorp test code: 8300

## 2022-02-15 LAB — MISC LABCORP TEST (SEND OUT)
LabCorp test name: 8300
Labcorp test code: 8300

## 2022-03-02 IMAGING — CT CT HIP*L* W/O CM
2 of 5 series · 17 of 46 positions shown, 19 images · non-contrast
Comparison: Same-day x-ray

CLINICAL DATA: Evaluate left hip fracture

EXAM:
CT OF THE LEFT HIP WITHOUT CONTRAST
TECHNIQUE: Multidetector CT imaging of the left hip was performed according to
the standard protocol. Multiplanar CT image reconstructions were
also generated.

[Series 3: axial st · axial · 0.45mm/px · z∈[-1198,-1014]mm · 14 of 104 slices shown, 16 images]
[im 6/104  soft-tissue]
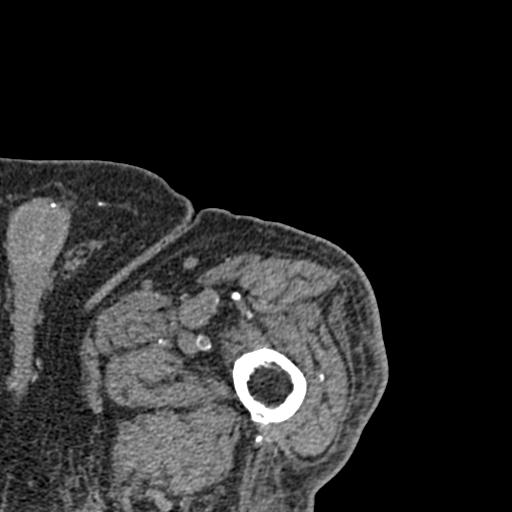
[im 6/104  bone]
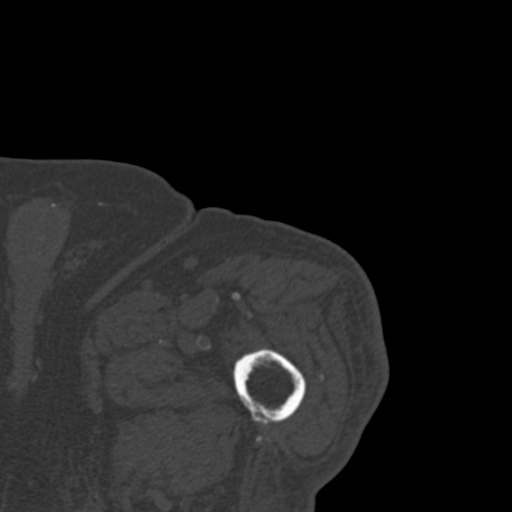
[im 16/104  soft-tissue]
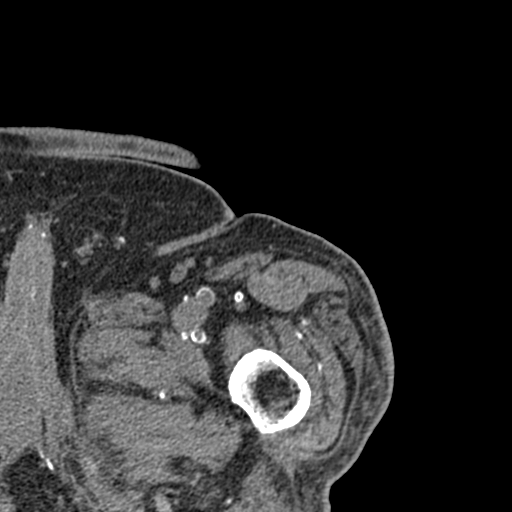
[im 21/104  soft-tissue]
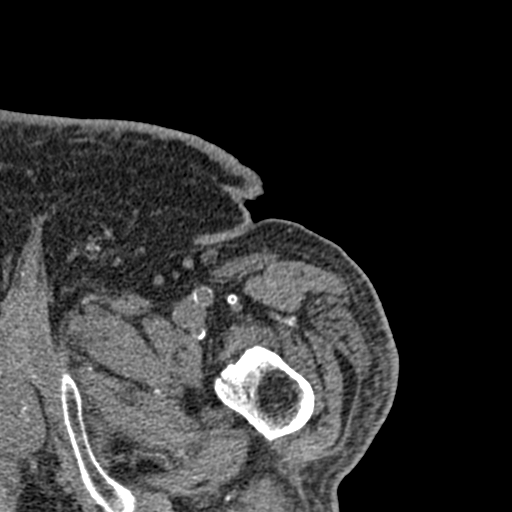
[im 26/104  soft-tissue]
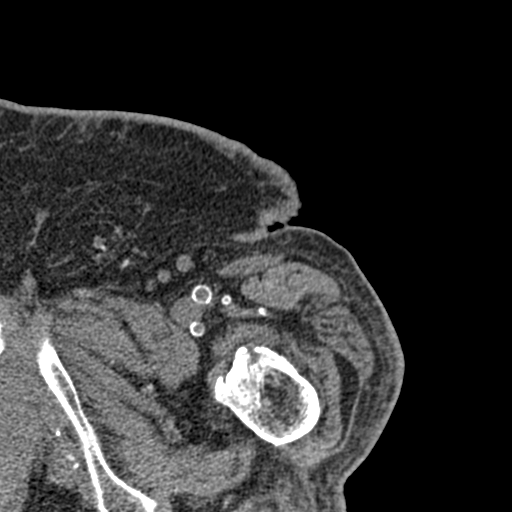
[im 37/104  soft-tissue]
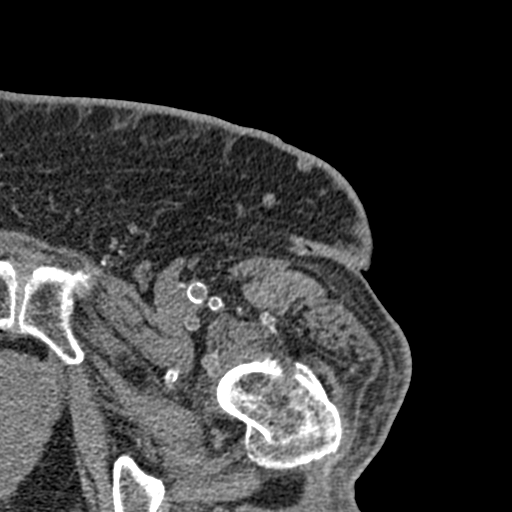
[im 42/104  soft-tissue]
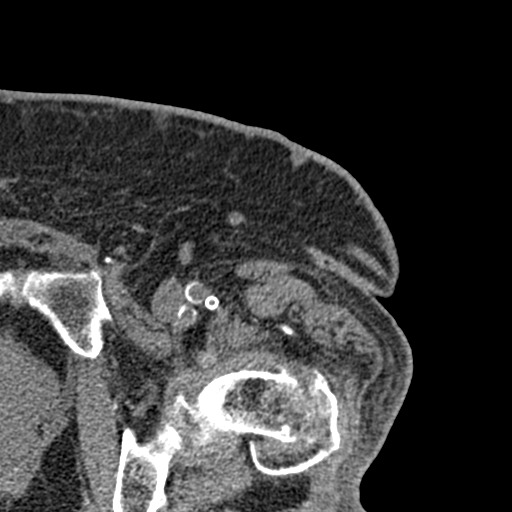
[im 47/104  soft-tissue]
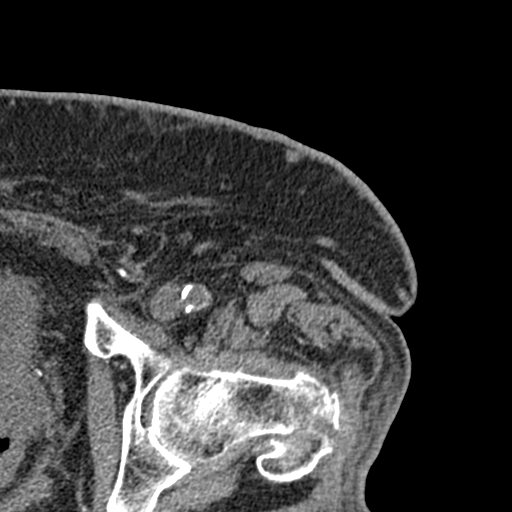
[im 57/104  soft-tissue]
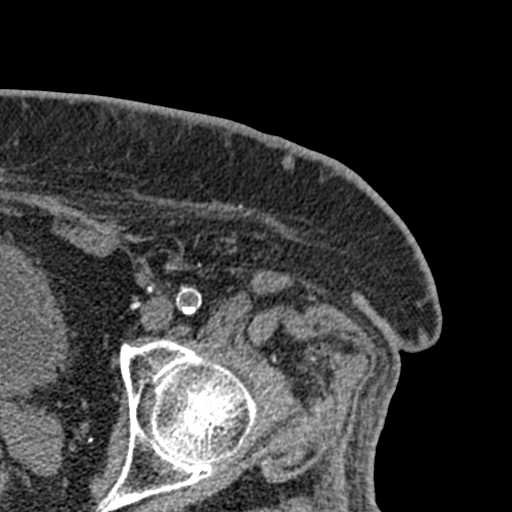
[im 62/104  soft-tissue]
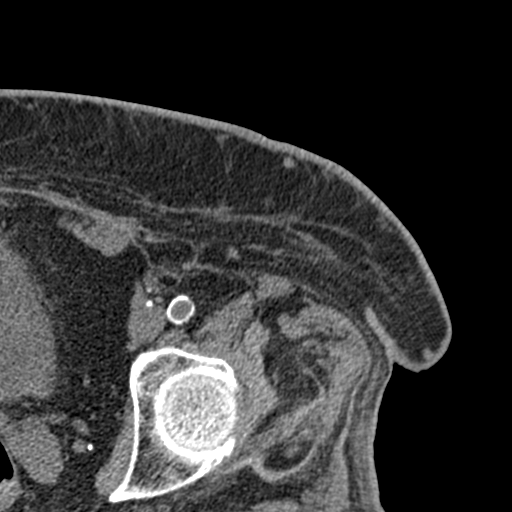
[im 62/104  bone]
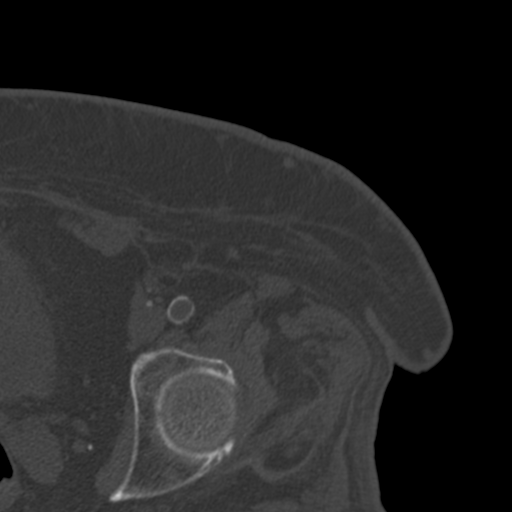
[im 67/104  soft-tissue]
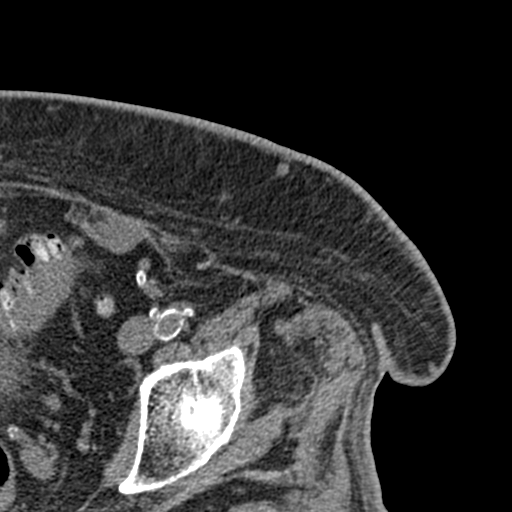
[im 78/104  soft-tissue]
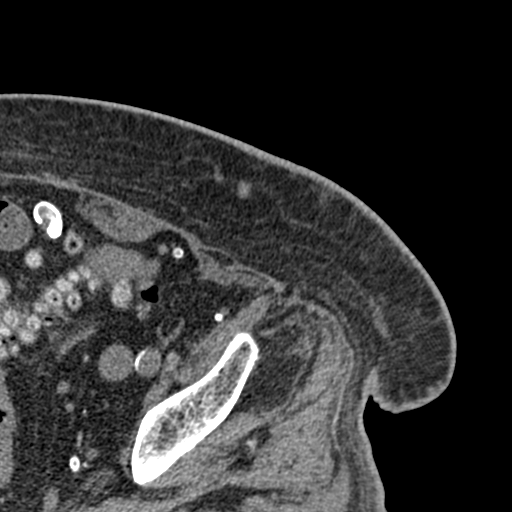
[im 83/104  soft-tissue]
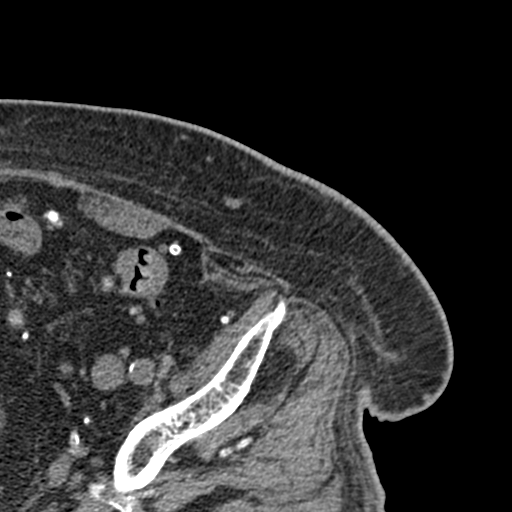
[im 88/104  soft-tissue]
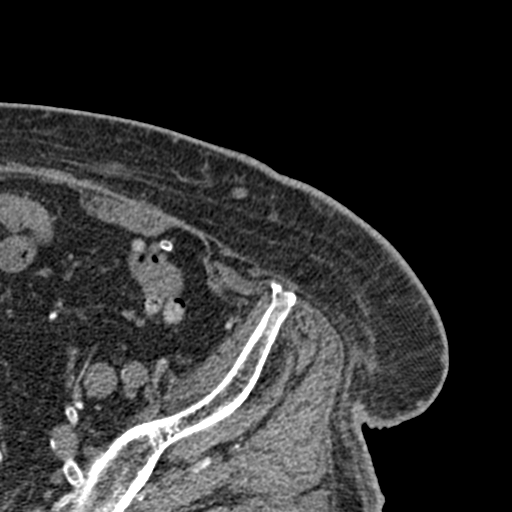
[im 98/104  soft-tissue]
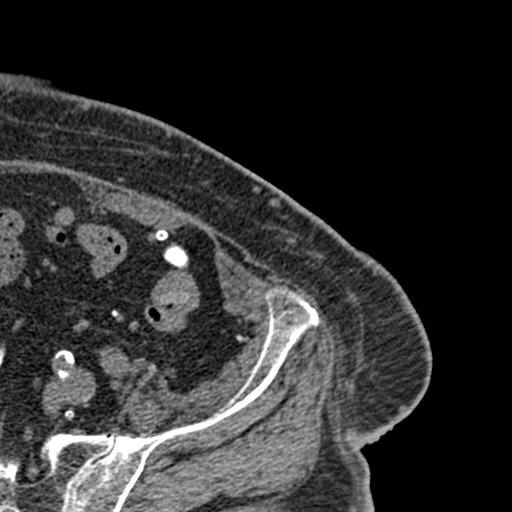

[Series 9: coronal st · coronal · 0.39mm/px · 3 of 96 slices shown]
[im 24/96  soft-tissue]
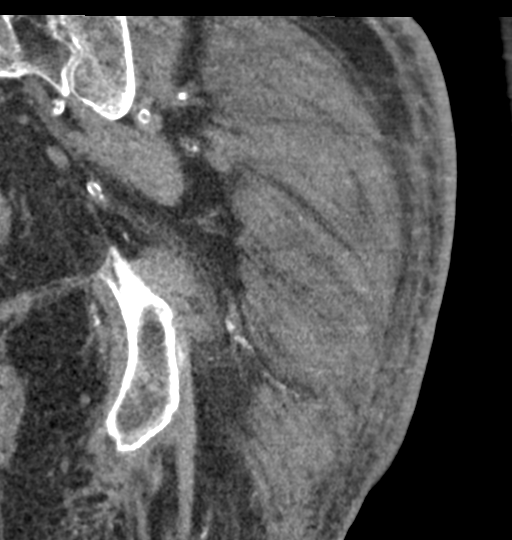
[im 48/96  soft-tissue]
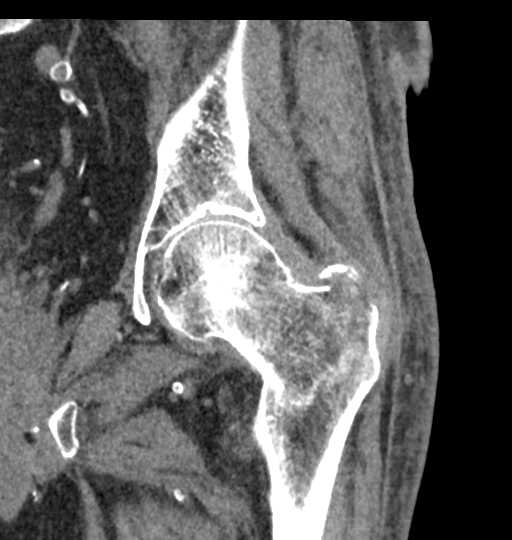
[im 72/96  soft-tissue]
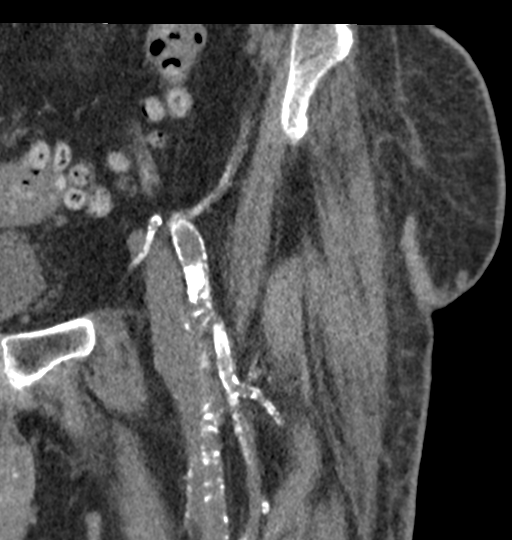

[17 of 46 positions shown; findings below may reference images not displayed]

FINDINGS: Bones/Joint/Cartilage

Acute intertrochanteric fracture of the proximal left femur without
significant displacement (series 11, images 32-49). Mild anterior
apex angulation. No fracture extension is seen into the left femoral
head or neck. Hip joint is intact without dislocation. Mild left hip
osteoarthritis. The visualized portion of the left hemipelvis is
intact without evidence of fracture or diastasis.

Ligaments

Suboptimally assessed by CT.

Muscles and Tendons

No acute musculotendinous abnormality by CT.

Soft tissues

Mild edema within the soft tissues overlying the posterolateral
aspect of the left hip. No organized hematoma. No left inguinal
lymphadenopathy. Extensive age advanced atherosclerotic
calcifications. Extensive sigmoid diverticulosis.
IMPRESSION: 1. Acute intertrochanteric fracture of the proximal left femur
without significant displacement. Mild anterior apex angulation.
2. Mild left hip osteoarthritis.
3. Extensive age advanced atherosclerotic calcifications.
4. Sigmoid diverticulosis.

## 2022-07-21 ENCOUNTER — Emergency Department
Admission: EM | Admit: 2022-07-21 | Discharge: 2022-07-21 | Disposition: A | Discharge status: Skilled Nursing Facility | Payer: Medicare Other | Attending: Emergency Medicine | Admitting: Emergency Medicine

## 2022-07-21 ENCOUNTER — Emergency Department: Payer: Medicare Other

## 2022-07-21 ENCOUNTER — Other Ambulatory Visit: Payer: Self-pay

## 2022-07-21 DIAGNOSIS — S7001XA Contusion of right hip, initial encounter: Secondary | ICD-10-CM | POA: Insufficient documentation

## 2022-07-21 DIAGNOSIS — Z992 Dependence on renal dialysis: Secondary | ICD-10-CM | POA: Diagnosis not present

## 2022-07-21 DIAGNOSIS — E1122 Type 2 diabetes mellitus with diabetic chronic kidney disease: Secondary | ICD-10-CM | POA: Diagnosis not present

## 2022-07-21 DIAGNOSIS — Y92129 Unspecified place in nursing home as the place of occurrence of the external cause: Secondary | ICD-10-CM | POA: Diagnosis not present

## 2022-07-21 DIAGNOSIS — N186 End stage renal disease: Secondary | ICD-10-CM | POA: Insufficient documentation

## 2022-07-21 DIAGNOSIS — R000 Tachycardia, unspecified: Secondary | ICD-10-CM | POA: Diagnosis not present

## 2022-07-21 DIAGNOSIS — W1830XA Fall on same level, unspecified, initial encounter: Secondary | ICD-10-CM | POA: Insufficient documentation

## 2022-07-21 DIAGNOSIS — E119 Type 2 diabetes mellitus without complications: Secondary | ICD-10-CM

## 2022-07-21 DIAGNOSIS — I120 Hypertensive chronic kidney disease with stage 5 chronic kidney disease or end stage renal disease: Secondary | ICD-10-CM | POA: Insufficient documentation

## 2022-07-21 LAB — CBC WITH DIFFERENTIAL/PLATELET
Abs Immature Granulocytes: 0.29 10*3/uL — ABNORMAL HIGH (ref 0.00–0.07)
Basophils Absolute: 0.1 10*3/uL (ref 0.0–0.1)
Basophils Relative: 1 %
Eosinophils Absolute: 0.1 10*3/uL (ref 0.0–0.5)
Eosinophils Relative: 1 %
HCT: 43.6 % (ref 39.0–52.0)
Hemoglobin: 14.4 g/dL (ref 13.0–17.0)
Immature Granulocytes: 2 %
Lymphocytes Relative: 6 %
Lymphs Abs: 0.8 10*3/uL (ref 0.7–4.0)
MCH: 32.1 pg (ref 26.0–34.0)
MCHC: 33 g/dL (ref 30.0–36.0)
MCV: 97.1 fL (ref 80.0–100.0)
Monocytes Absolute: 0.9 10*3/uL (ref 0.1–1.0)
Monocytes Relative: 7 %
Neutro Abs: 11.5 10*3/uL — ABNORMAL HIGH (ref 1.7–7.7)
Neutrophils Relative %: 83 %
Platelets: 204 10*3/uL (ref 150–400)
RBC: 4.49 MIL/uL (ref 4.22–5.81)
RDW: 18 % — ABNORMAL HIGH (ref 11.5–15.5)
WBC: 13.7 10*3/uL — ABNORMAL HIGH (ref 4.0–10.5)
nRBC: 0.1 % (ref 0.0–0.2)

## 2022-07-21 LAB — BASIC METABOLIC PANEL
Anion gap: 20 — ABNORMAL HIGH (ref 5–15)
BUN: 48 mg/dL — ABNORMAL HIGH (ref 8–23)
CO2: 19 mmol/L — ABNORMAL LOW (ref 22–32)
Calcium: 9.2 mg/dL (ref 8.9–10.3)
Chloride: 95 mmol/L — ABNORMAL LOW (ref 98–111)
Creatinine, Ser: 5.29 mg/dL — ABNORMAL HIGH (ref 0.61–1.24)
GFR, Estimated: 11 mL/min — ABNORMAL LOW (ref >60)
Glucose, Bld: 181 mg/dL — ABNORMAL HIGH (ref 70–99)
Potassium: 5.4 mmol/L — ABNORMAL HIGH (ref 3.5–5.1)
Sodium: 134 mmol/L — ABNORMAL LOW (ref 135–145)

## 2022-07-21 NOTE — Discharge Instructions (Signed)
Your lab test and xray are all okay today. Please follow up with [facility] for make up dialysis session today.

## 2022-07-21 NOTE — ED Provider Notes (Signed)
[facility] Provider Note    Event Date/Time   First MD Initiated Contact with Patient 07/21/22 0701     (approximate)   History   Chief Complaint: Fall [phone] arrived to the ED via [facility] from [facility] dialysis with a c/o left hip pain s/p ground level fall. Per the EMS report, while attempting to transfer from his wheelchair to the dialysis chair the pt was unable to bear weight on the left leg causing him to lose balance and fall. The pt denies hitting his head, he denies any LOC. He is aox4 rr even and unlabored at this time.  )   HPI  Lizzie Cokley is a 66 y.o. male with an extensive medical history including diabetes, hypertension, ESRD on hemodialysis Tuesday Thursday Saturday, heart transplant from December 2000, ICD placement who was sent to the ED today from dialysis due to a fall.  Patient notes that he was recently hospitalized [facility] for 3 weeks in August, discharged yesterday to acute rehab due to poor leg strength limiting mobility.  Today, while attempting to transfer from wheelchair to dialysis chair, he was unable to move himself and fell to the ground on his right hip.  He reports breaking that hip a year ago, and he has some mild pain in the hip now but nothing like what he experienced with the fracture.  He states that it feels fine.  Nonradiating.  Able to move.  Denies any lightheadedness or syncope.  No palpitations chest pain or shortness of breath or any other symptoms.  Feels totally normal currently except for some mild right hip pain.     Physical Exam   Triage Vital Signs: ED Triage Vitals  Enc Vitals Group     BP 07/21/22 0658 (!) 137/95     Pulse Rate 07/21/22 0658 (!) 111     Resp 07/21/22 0658 18     Temp 07/21/22 0658 97.7 F (36.5 C)     Temp Source 07/21/22 0658 Oral     SpO2 07/21/22 0656 97 %     Weight 07/21/22 0702 224 lb 13.9 oz (102 kg)     Height 07/21/22 0702 5\' 10"  (1.778 m)     Head Circumference  --      Peak Flow --      Pain Score 07/21/22 0702 3     Pain Loc --      Pain Edu? --      Excl. in [place]? --     Most recent vital signs: Vitals:   07/21/22 0708 07/21/22 0730  BP:  129/89  Pulse: (!) 102 (!) 103  Resp: 16 (!) 24  Temp:    SpO2: 97% 96%    General: Awake, no distress.  CV:  Good peripheral perfusion.  Tachycardia heart rate 105 Resp:  Normal effort.  Clear to auscultation bilaterally Abd:  No distention.  Soft nontender Other:  Pelvis and hips nontender.  No tenderness along the neck of the femur and diaphysis.  No pain with hip range of motion.  Symmetric leg length.  Intact sensation and motor function.   ED Results / Procedures / Treatments   Labs (all labs ordered are listed, but only abnormal results are displayed) Labs Reviewed  BASIC METABOLIC PANEL - Abnormal; Notable for the following components:      Result Value   Sodium 134 (*)    Potassium 5.4 (*)    Chloride 95 (*)    CO2 19 (*)  Glucose, Bld 181 (*)    BUN 48 (*)    Creatinine, Ser 5.29 (*)    GFR, Estimated 11 (*)    Anion gap 20 (*)    All other components within normal limits  CBC WITH DIFFERENTIAL/PLATELET - Abnormal; Notable for the following components:   WBC 13.7 (*)    RDW 18.0 (*)    Neutro Abs 11.5 (*)    Abs Immature Granulocytes 0.29 (*)    All other components within normal limits     EKG Interpreted by me Sinus tachycardia rate 111.  Right axis.  Right bundle branch block.  Type II Wenckebach AV block.  No acute ischemic changes.  No acute change compared to previous EKG on July 02, 2021.   RADIOLOGY X-ray right hip and pelvis interpreted by me, negative for fracture.  Radiology report reviewed.   PROCEDURES:  Procedures   MEDICATIONS ORDERED IN ED: Medications - No data to display   IMPRESSION / MDM / [place] / ED COURSE  I reviewed the triage vital signs and the nursing notes.                              Differential diagnosis  includes, but is not limited to, anemia, electrolyte abnormality, hip fracture, uremia  Patient's presentation is most consistent with acute complicated illness / injury requiring diagnostic workup.  Patient comes the ED complaining of right hip pain after mechanical fall.  He has an extensive severe medical history, necessitating labs and x-ray despite reassuring symptomatology and exam.   Clinical Course as of 07/21/22 0846  Tue Jul 21, 2022  0804 Hip x-ray interpreted by me, negative for fracture or dislocation.  Radiology report reviewed. [PS]    Clinical Course User Index [PS] Carrie Mew, MD    ----------------------------------------- 8:47 AM on 07/21/2022 ----------------------------------------- Labs unremarkable, stable CKD.  Does not require admission, can be discharged for make-up dialysis.  Radiology report of the x-ray suggests a left pubic ramus fracture, but patient has no pain or tenderness in this area.   FINAL CLINICAL IMPRESSION(S) / ED DIAGNOSES   Final diagnoses:  Contusion of right hip, initial encounter  ESRD on hemodialysis ([place])  Type 2 diabetes mellitus without complication, with long-term current use of insulin ([place])     Rx / DC Orders   ED Discharge Orders     None        Note:  This document was prepared using Dragon voice recognition software and may include unintentional dictation errors.   Carrie Mew, MD 07/21/22 [phone]

## 2022-07-21 NOTE — ED Notes (Signed)
[facility] called for transport back to rehab facility/ [facility]

## 2022-07-21 NOTE — ED Notes (Signed)
Assumed care, pt in NAD breathing e/u on room air. Transported to X ray pt with no needs.

## 2022-07-21 NOTE — Progress Notes (Signed)
Patient is set up for extra treatment tomorrow at 7:00am.

## 2022-12-17 DEATH — deceased
